# Patient Record
Sex: Male | Born: 1990 | Race: White | Hispanic: No | Marital: Single | Smoking: Current every day smoker
Health system: Southern US, Community
[De-identification: ages and names within clinical notes are randomized; demographics above are authoritative.]

---

## 2015-11-08 ENCOUNTER — Emergency Department (HOSPITAL_COMMUNITY): Payer: Self-pay

## 2015-11-08 ENCOUNTER — Emergency Department (HOSPITAL_COMMUNITY)
Admission: EM | Admit: 2015-11-08 | Discharge: 2015-11-08 | Disposition: A | Discharge status: Home / Self Care | Payer: Self-pay | Attending: Emergency Medicine | Admitting: Emergency Medicine

## 2015-11-08 ENCOUNTER — Encounter (HOSPITAL_COMMUNITY): Payer: Self-pay | Admitting: *Deleted

## 2015-11-08 DIAGNOSIS — Y990 Civilian activity done for income or pay: Secondary | ICD-10-CM | POA: Insufficient documentation

## 2015-11-08 DIAGNOSIS — Y9389 Activity, other specified: Secondary | ICD-10-CM | POA: Insufficient documentation

## 2015-11-08 DIAGNOSIS — T148XXA Other injury of unspecified body region, initial encounter: Secondary | ICD-10-CM

## 2015-11-08 DIAGNOSIS — W132XXA Fall from, out of or through roof, initial encounter: Secondary | ICD-10-CM | POA: Insufficient documentation

## 2015-11-08 DIAGNOSIS — S31119A Laceration without foreign body of abdominal wall, unspecified quadrant without penetration into peritoneal cavity, initial encounter: Secondary | ICD-10-CM | POA: Insufficient documentation

## 2015-11-08 DIAGNOSIS — W19XXXA Unspecified fall, initial encounter: Secondary | ICD-10-CM

## 2015-11-08 DIAGNOSIS — F17210 Nicotine dependence, cigarettes, uncomplicated: Secondary | ICD-10-CM | POA: Insufficient documentation

## 2015-11-08 DIAGNOSIS — Y929 Unspecified place or not applicable: Secondary | ICD-10-CM | POA: Insufficient documentation

## 2015-11-08 DIAGNOSIS — S21219A Laceration without foreign body of unspecified back wall of thorax without penetration into thoracic cavity, initial encounter: Secondary | ICD-10-CM | POA: Insufficient documentation

## 2015-11-08 DIAGNOSIS — R791 Abnormal coagulation profile: Secondary | ICD-10-CM | POA: Insufficient documentation

## 2015-11-08 LAB — I-STAT CHEM 8, ED
BUN: 10 mg/dL (ref 6–20)
Calcium, Ion: 1.16 mmol/L (ref 1.15–1.40)
Chloride: 102 mmol/L (ref 101–111)
Creatinine, Ser: 0.9 mg/dL (ref 0.61–1.24)
Glucose, Bld: 76 mg/dL (ref 65–99)
HCT: 50 % (ref 39.0–52.0)
Hemoglobin: 17 g/dL (ref 13.0–17.0)
Potassium: 4 mmol/L (ref 3.5–5.1)
Sodium: 142 mmol/L (ref 135–145)
TCO2: 28 mmol/L (ref 0–100)

## 2015-11-08 LAB — CBC WITH DIFFERENTIAL/PLATELET
Basophils Absolute: 0.1 10*3/uL (ref 0.0–0.1)
Basophils Relative: 0 %
EOS PCT: 3 %
Eosinophils Absolute: 0.4 10*3/uL (ref 0.0–0.7)
HEMATOCRIT: 47.7 % (ref 39.0–52.0)
Hemoglobin: 15.6 g/dL (ref 13.0–17.0)
Lymphocytes Relative: 18 %
Lymphs Abs: 2.5 10*3/uL (ref 0.7–4.0)
MCH: 31.8 pg (ref 26.0–34.0)
MCHC: 32.7 g/dL (ref 30.0–36.0)
MCV: 97.1 fL (ref 78.0–100.0)
MONO ABS: 1 10*3/uL (ref 0.1–1.0)
Monocytes Relative: 8 %
Neutro Abs: 9.6 10*3/uL — ABNORMAL HIGH (ref 1.7–7.7)
Neutrophils Relative %: 71 %
PLATELETS: 220 10*3/uL (ref 150–400)
RBC: 4.91 MIL/uL (ref 4.22–5.81)
RDW: 13.1 % (ref 11.5–15.5)
WBC: 13.5 10*3/uL — AB (ref 4.0–10.5)

## 2015-11-08 LAB — TYPE AND SCREEN
ABO/RH(D): A POS
Antibody Screen: NEGATIVE

## 2015-11-08 LAB — PROTIME-INR
INR: 1.09
PROTHROMBIN TIME: 14.1 s (ref 11.4–15.2)

## 2015-11-08 LAB — I-STAT CG4 LACTIC ACID, ED: Lactic Acid, Venous: 2.1 mmol/L (ref 0.5–1.9)

## 2015-11-08 LAB — ABO/RH: ABO/RH(D): A POS

## 2015-11-08 MED ORDER — FENTANYL CITRATE (PF) 100 MCG/2ML IJ SOLN
100.0000 ug | Freq: Once | INTRAMUSCULAR | Status: AC
  Administered 2015-11-08: 100 ug via INTRAVENOUS

## 2015-11-08 MED ORDER — FENTANYL CITRATE (PF) 100 MCG/2ML IJ SOLN
INTRAMUSCULAR | Status: AC
  Filled 2015-11-08: qty 2

## 2015-11-08 MED ORDER — LIDOCAINE-EPINEPHRINE (PF) 2 %-1:200000 IJ SOLN
10.0000 mL | Freq: Once | INTRAMUSCULAR | Status: AC
  Administered 2015-11-08: 10 mL via INTRADERMAL

## 2015-11-08 MED ORDER — SODIUM CHLORIDE 0.9 % IV BOLUS (SEPSIS)
1000.0000 mL | Freq: Once | INTRAVENOUS | Status: AC
  Administered 2015-11-08: 1000 mL via INTRAVENOUS

## 2015-11-08 MED ORDER — IOPAMIDOL (ISOVUE-300) INJECTION 61%
INTRAVENOUS | Status: AC
  Administered 2015-11-08: 100 mL
  Filled 2015-11-08: qty 100

## 2015-11-08 MED ORDER — HYDROCODONE-ACETAMINOPHEN 5-325 MG PO TABS
1.0000 | ORAL_TABLET | Freq: Once | ORAL | Status: AC
  Administered 2015-11-08: 1 via ORAL
  Filled 2015-11-08: qty 1

## 2015-11-08 MED ORDER — HYDROCODONE-ACETAMINOPHEN 5-325 MG PO TABS: 1.0000 | ORAL_TABLET | Freq: Four times a day (QID) | ORAL | 0 refills | Status: AC | PRN

## 2015-11-08 MED ORDER — FENTANYL CITRATE (PF) 100 MCG/2ML IJ SOLN
75.0000 ug | Freq: Once | INTRAMUSCULAR | Status: AC
  Administered 2015-11-08: 75 ug via INTRAVENOUS
  Filled 2015-11-08: qty 2

## 2015-11-08 MED ORDER — LIDOCAINE-EPINEPHRINE (PF) 2 %-1:200000 IJ SOLN
INTRAMUSCULAR | Status: AC
  Filled 2015-11-08: qty 10

## 2015-11-08 NOTE — ED Notes (Signed)
Co workers at bedside

## 2015-11-08 NOTE — ED Notes (Addendum)
New pressure dressing applied to patient right flank. Waiting for MD to re-assess patient. Pt remains alert and oriented x4. Following commands and answering questions appropriately.

## 2015-11-08 NOTE — Progress Notes (Signed)
Responded too level 2 trauma. Patient is a 25 yr old male who fail from ladder causing injury to back. Patient asked that his supervisor who had arrived at ED to be allow at bedside. Pt. Is alert and going to CT. Will follow as needed.   11/08/15 1222  Clinical Encounter Type  Visited With Patient;Health care provider  Visit Type Initial;ED  Referral From Nurse  Spiritual Encounters  Spiritual Needs Emotional  Stress Factors  Patient Stress Factors None identified  Advance Directives (For Healthcare)  Does patient have an advance directive? No  Would patient like information on creating an advanced directive? Yes - Educational materials given  Fae PippinWatlington, Melissaann Dizdarevic, Chaplain, [facility], Pager [phone]

## 2015-11-08 NOTE — ED Triage Notes (Signed)
Pt in via [facility] per report pt fell 20-25 ft from 2nd floor apt roof & 2 ft terracata roofing fell onto the pts back, pt has 4-5 inch lac to R flank & 3 inch lac to L flank, large amt of blood loss noted to R flank, denies LOC, pt arrives in C collar & LSB

## 2015-11-08 NOTE — Discharge Instructions (Signed)
We saw you in the ER for your WOUND. °Please read the instructions provided on wound care. °Keep the area clean and dry, apply bacitracin ointment daily and take the medications provided. °RETURN TO THE ER IF THERE IS INCREASED PAIN, REDNESS, PUS COMING OUT from the wound site.  °

## 2015-11-08 NOTE — Procedures (Signed)
Brief operative note  Preoperative diagnosis: 6 cm right flank laceration, 3cm left flank laceration Post-operative diagnosis: same Procedure: layered closure 6 cm R flank laceration, simple closure 3 cm L flank laceration Surgeon: Violeta GelinasBurke Ivannah Zody, MD Anesthesia: local plus IV pain medication Procedure in detail: Both wounds were thoroughly irrigated and prepped in a sterile fashion. Attention was directed to the right flank wound. There were some active bleeders in the musculature which were controlled with figure-of-eight 3-0 Vicryl sutures. Next, the wound was closed with running 3-0 Prolene. There was some muscular hematoma present but good hemostasis. Next, left flank laceration was closed in simple fashion with running 3-0 Prolene. He tolerated the procedure well.  Violeta GelinasBurke Minervia Osso, MD, MPH, FACS Trauma: [phone] General Surgery: [phone]

## 2015-11-08 NOTE — Progress Notes (Signed)
Orthopedic Tech Progress Note Patient Details:  Chanetta MarshallJerry Belmar 10-03-1990 [id number]  Patient ID: Chanetta MarshallJerry Kagan, male   DOB: 10-03-1990, 25 y.o.   MRN: [id number]   Nikki DomCrawford, Nohemy Koop 11/08/2015, 12:39 PM Made level 2 trauma visit

## 2015-11-08 NOTE — ED Notes (Signed)
Pt noted to be bleeding through pressure dressing applied on arrival. Large amount of further blood loss noted to chucks and sheet. MD Rhunette CroftNanavati reminded patient has returned to room and needs reassessment.

## 2015-11-08 NOTE — ED Notes (Signed)
Pt in CT scan, mod amt of blood present, bandage changed, Nanavati aware, This RN & Psychologist, clinicalAna RN at bedside in CT scan, pt to have scan completed & Rhunette CroftNanavati, MD will reassess pt when he returns to the trauma room, pt A&O x4, follows commands, speaks in complete sentences

## 2015-11-08 NOTE — Consult Note (Signed)
Reason for Consult:B flank lacerations Referring Physician: Claris Pongnkit Nanavati  Donald Frye is an 25 y.o. male.  HPI: Donald SorrowJerry was working up on a second story roof when he fell. After falling, he was struck by some terra-cotta tile in his lower back. No loss of consciousness. He came in as a level II trauma. Workup has revealed bilateral flank lacerations with some hemorrhage from the right side. We were asked to see him for management of his lacerations. No internal injuries were noted on his workup and he has been hemodynamically normal.  History reviewed. No pertinent past medical history.  History reviewed. No pertinent surgical history.  No family history on file.  Social History:  reports that he has been smoking Cigarettes.  He has been smoking about 0.50 packs per day. He has never used smokeless tobacco. He reports that he drinks about 8.4 oz of alcohol per week . His drug history is not on file.  Allergies:  Allergies  Allergen Reactions  . Penicillins Anaphylaxis    Medications: I have reviewed the patient's current medications.  Results for orders placed or performed during the hospital encounter of 11/08/15 (from the past 48 hour(s))  Protime-INR     Status: None   Collection Time: 11/08/15 12:13 PM  Result Value Ref Range   Prothrombin Time 14.1 11.4 - 15.2 seconds   INR 1.09   Type and screen     Status: None   Collection Time: 11/08/15 12:15 PM  Result Value Ref Range   ABO/RH(D) A POS    Antibody Screen NEG    Sample Expiration 11/11/2015   ABO/Rh     Status: None   Collection Time: 11/08/15 12:15 PM  Result Value Ref Range   ABO/RH(D) A POS   I-stat chem 8, ed     Status: None   Collection Time: 11/08/15 12:23 PM  Result Value Ref Range   Sodium 142 135 - 145 mmol/L   Potassium 4.0 3.5 - 5.1 mmol/L   Chloride 102 101 - 111 mmol/L   BUN 10 6 - 20 mg/dL   Creatinine, Ser 8.290.90 0.61 - 1.24 mg/dL   Glucose, Bld 76 65 - 99 mg/dL   Calcium, Ion 5.621.16 1.301.15 - 1.40  mmol/L   TCO2 28 0 - 100 mmol/L   Hemoglobin 17.0 13.0 - 17.0 g/dL   HCT 86.550.0 78.439.0 - 69.652.0 %  I-Stat CG4 Lactic Acid, ED     Status: Abnormal   Collection Time: 11/08/15 12:23 PM  Result Value Ref Range   Lactic Acid, Venous 2.10 (HH) 0.5 - 1.9 mmol/L   Comment NOTIFIED PHYSICIAN   CBC with Differential     Status: Abnormal   Collection Time: 11/08/15 12:50 PM  Result Value Ref Range   WBC 13.5 (H) 4.0 - 10.5 K/uL   RBC 4.91 4.22 - 5.81 MIL/uL   Hemoglobin 15.6 13.0 - 17.0 g/dL   HCT 29.547.7 28.439.0 - 13.252.0 %   MCV 97.1 78.0 - 100.0 fL   MCH 31.8 26.0 - 34.0 pg   MCHC 32.7 30.0 - 36.0 g/dL   RDW 44.013.1 10.211.5 - 72.515.5 %   Platelets 220 150 - 400 K/uL   Neutrophils Relative % 71 %   Neutro Abs 9.6 (H) 1.7 - 7.7 K/uL   Lymphocytes Relative 18 %   Lymphs Abs 2.5 0.7 - 4.0 K/uL   Monocytes Relative 8 %   Monocytes Absolute 1.0 0.1 - 1.0 K/uL   Eosinophils Relative 3 %  Eosinophils Absolute 0.4 0.0 - 0.7 K/uL   Basophils Relative 0 %   Basophils Absolute 0.1 0.0 - 0.1 K/uL    Ct Cervical Spine Wo Contrast  Result Date: 11/08/2015 CLINICAL DATA:  Fall 20-25 feet from second floor apartment roof onto back. Bilateral flank lacerations and bleeding. EXAM: CT CERVICAL SPINE WITHOUT CONTRAST TECHNIQUE: Multidetector CT imaging of the cervical spine was performed without intravenous contrast. Multiplanar CT image reconstructions were also generated. COMPARISON:  None. FINDINGS: No fracture is detected in the cervical spine. No prevertebral soft tissue swelling. There is straightening of the cervical spine, usually due to positioning and/or muscle spasm. Dens is well positioned between the lateral masses of C1. The lateral masses appear well-aligned. Cervical disc heights are preserved, with no significant spondylosis. No significant facet arthropathy. No significant cervical foraminal stenosis. No cervical spine subluxation. Visualized mastoid air cells appear clear. No evidence of intra-axial hemorrhage  in the visualized brain. No gross cervical canal hematoma. No significant pulmonary nodules at the visualized lung apices. No cervical adenopathy or other significant neck soft tissue abnormality. IMPRESSION: No cervical spine fracture or subluxation. Electronically Signed   By: Delbert Phenix M.D.   On: 11/08/2015 14:22   Ct Abdomen Pelvis W Contrast  Result Date: 11/08/2015 CLINICAL DATA:  25 y/o M; status post fall 20-25 feet from second floor apartment roof after which 2 foot tear caught a roofing fell onto patient's back. The patient denies loss of consciousness. Lacerations to the flanks bilaterally. Bony new dx EXAM: CT ABDOMEN AND PELVIS WITH CONTRAST CT LUMBAR SPINE WITHOUT CONTRAST TECHNIQUE: Multidetector CT imaging of the abdomen and pelvis was performed using the standard protocol following bolus administration of intravenous contrast. CONTRAST:  1 ISOVUE-300 IOPAMIDOL (ISOVUE-300) INJECTION 61% COMPARISON:  None. FINDINGS: CT abdomen and pelvis: Lower chest:  No acute findings. Hepatobiliary: No masses or other significant abnormality. Pancreas: No mass, inflammatory changes, or other significant abnormality. Spleen: Within normal limits in size and appearance. Adrenals/Urinary Tract: No masses identified. No evidence of hydronephrosis. Stomach/Bowel: No evidence of obstruction, inflammatory process, or abnormal fluid collections. Vascular/Lymphatic: No pathologically enlarged lymph nodes. No evidence of abdominal aortic aneurysm. Reproductive: No mass or other significant abnormality. Other: None. Musculoskeletal: There is a large laceration of the right flank skin extending into the right paraspinal muscles. Deep to the laceration within right paraspinal muscles at the L1 level there is active extravasation of contrast increasing between venous and delayed phases (series 3, image 37 and series 2, image 25). The developing hematoma measures approximately 22 x 30 mm axially. There are several  Schmorl's nodes of multiple endplates. No acute fracture or dislocation is identified. L1 vertebral body hemangioma. CT lumbar spine: Alignment: Lumbar lordosis is maintained. No listhesis. Mild leftward curvature with apex at L4. Bones: No acute fracture or malalignment is identified. There are endplate Schmorl's nodes of superior endplates of T12, L1, and L2. Soft tissues: Laceration to the right flank with swelling of right paraspinal muscles and developing right paraspinal muscle hematoma better characterize the above. Disc levels: There are multiple small disc bulges throughout the lumbar spine without high-grade canal stenosis or foraminal narrowing identified. IMPRESSION: 1. Large right flank laceration. Active extravasation of contrast into the right paraspinal muscles at the L1 level with developing hematoma measuring up to approximately 3 cm. No associated large vessel is identified. 2. No acute fracture or dislocation is identified. 3. No acute or organ or vascular injury in the abdomen or pelvis is identified Electronically Signed  By: Mitzi Hansen M.D.   On: 11/08/2015 14:33   Ct L-spine No Charge  Result Date: 11/08/2015 CLINICAL DATA:  26 y/o M; status post fall 20-25 feet from second floor apartment roof after which 2 foot tear caught a roofing fell onto patient's back. The patient denies loss of consciousness. Lacerations to the flanks bilaterally. Bony new dx EXAM: CT ABDOMEN AND PELVIS WITH CONTRAST CT LUMBAR SPINE WITHOUT CONTRAST TECHNIQUE: Multidetector CT imaging of the abdomen and pelvis was performed using the standard protocol following bolus administration of intravenous contrast. CONTRAST:  1 ISOVUE-300 IOPAMIDOL (ISOVUE-300) INJECTION 61% COMPARISON:  None. FINDINGS: CT abdomen and pelvis: Lower chest:  No acute findings. Hepatobiliary: No masses or other significant abnormality. Pancreas: No mass, inflammatory changes, or other significant abnormality. Spleen: Within  normal limits in size and appearance. Adrenals/Urinary Tract: No masses identified. No evidence of hydronephrosis. Stomach/Bowel: No evidence of obstruction, inflammatory process, or abnormal fluid collections. Vascular/Lymphatic: No pathologically enlarged lymph nodes. No evidence of abdominal aortic aneurysm. Reproductive: No mass or other significant abnormality. Other: None. Musculoskeletal: There is a large laceration of the right flank skin extending into the right paraspinal muscles. Deep to the laceration within right paraspinal muscles at the L1 level there is active extravasation of contrast increasing between venous and delayed phases (series 3, image 37 and series 2, image 25). The developing hematoma measures approximately 22 x 30 mm axially. There are several Schmorl's nodes of multiple endplates. No acute fracture or dislocation is identified. L1 vertebral body hemangioma. CT lumbar spine: Alignment: Lumbar lordosis is maintained. No listhesis. Mild leftward curvature with apex at L4. Bones: No acute fracture or malalignment is identified. There are endplate Schmorl's nodes of superior endplates of T12, L1, and L2. Soft tissues: Laceration to the right flank with swelling of right paraspinal muscles and developing right paraspinal muscle hematoma better characterize the above. Disc levels: There are multiple small disc bulges throughout the lumbar spine without high-grade canal stenosis or foraminal narrowing identified. IMPRESSION: 1. Large right flank laceration. Active extravasation of contrast into the right paraspinal muscles at the L1 level with developing hematoma measuring up to approximately 3 cm. No associated large vessel is identified. 2. No acute fracture or dislocation is identified. 3. No acute or organ or vascular injury in the abdomen or pelvis is identified Electronically Signed   By: Mitzi Hansen M.D.   On: 11/08/2015 14:33    Review of Systems  Constitutional:  Negative for fever.  Eyes: Negative for blurred vision.  Respiratory: Negative for cough and shortness of breath.   Cardiovascular: Negative for chest pain.  Gastrointestinal: Negative for abdominal pain, nausea and vomiting.  Genitourinary: Negative.   Musculoskeletal: Positive for back pain.       At lacerations  Skin: Negative for rash.  Neurological: Negative for sensory change, speech change, focal weakness, loss of consciousness and headaches.  Endo/Heme/Allergies: Negative.   Psychiatric/Behavioral: Negative.    Blood pressure 122/73, pulse 103, temperature 98.9 F (37.2 C), temperature source Oral, resp. rate 23, height 5\' 10"  (1.778 m), weight 93 kg (205 lb), SpO2 96 %. Physical Exam  Constitutional: He is oriented to person, place, and time. He appears well-developed and well-nourished. No distress.  HENT:  Head: Normocephalic.  Right Ear: External ear normal.  Left Ear: External ear normal.  Mouth/Throat: Oropharynx is clear and moist.  Eyes: EOM are normal. Pupils are equal, round, and reactive to light.  Neck: No tracheal deviation present.  Cardiovascular: Normal rate,  regular rhythm and normal heart sounds.   Respiratory: Effort normal and breath sounds normal. No respiratory distress. He has no wheezes.  GI: Soft. He exhibits no distension. There is no tenderness. There is no rebound.  Musculoskeletal:       Arms: 6 cm right flank laceration down into musculature with a couple small areas of active bleeding. 3 cm superficial left flank laceration.  Neurological: He is alert and oriented to person, place, and time. He displays no atrophy and no tremor. He exhibits normal muscle tone. He displays no seizure activity. GCS eye subscore is 4. GCS verbal subscore is 5. GCS motor subscore is 6.  Moves all extremities well    Assessment/Plan: Fall 6 cm right flank laceration with some active bleeding 3 cm left flank laceration  He was given some intravenous pain  medication and the wounds were closed in the emergency department. Please refer to procedure note. Keep wounds covered for 48 hours. He would like to follow-up for suture removal in [facility] I advised him to see an urgent care or emergency department there in 2 weeks. No working for the 2 weeks until sutures are removed.  Miski Feldpausch E 11/08/2015, 3:19 PM

## 2015-11-09 NOTE — ED Provider Notes (Signed)
[facility]-EMERGENCY DEPT Provider Note   CSN: [id number] Arrival date & time: 11/08/15  1207     History   Chief Complaint Chief Complaint  Patient presents with  . Fall    HPI Donald Frye is a 25 y.o. male.  HPI  Donald Frye is a young and healthy male who was working up on a second story roof when he fell. Fall was due to loss of balance. After falling, he was struck by some terra-cotta tile in his lower back. Donald Frye fell onto his legs. Donald Frye didn't strike his head and he had no loss of consciousness. Donald Frye is having some bleeding from the R flank region and has back pain at that level. He denies any leg or pelvis pain. No numbness, tingling. Donald Frye has no pain in his legs or ankle. Donald Frye denies any drug use or drinking.  History reviewed. No pertinent past medical history.  There are no active problems to display for this patient.   History reviewed. No pertinent surgical history.     Home Medications    Prior to Admission medications   Medication Sig Start Date End Date Taking? Authorizing Provider  HYDROcodone-acetaminophen (NORCO/VICODIN) 5-325 MG tablet Take 1 tablet by mouth every 6 (six) hours as needed. 11/08/15   Derwood KaplanAnkit Kieren Adkison, MD    Family History No family history on file.  Social History Social History  Substance Use Topics  . Smoking status: Current Every Day Smoker    Packs/day: 0.50    Types: Cigarettes  . Smokeless tobacco: Never Used  . Alcohol use 8.4 oz/week    14 Cans of beer per week     Allergies   Penicillins   Review of Systems Review of Systems  ROS 10 Systems reviewed and are negative for acute change except as noted in the HPI.     Physical Exam Updated Vital Signs BP 114/58   Pulse 97   Temp 98.9 F (37.2 C) (Oral)   Resp 23   Ht 5\' 10"  (1.778 m)   Wt 205 lb (93 kg)   SpO2 92%   BMI 29.41 kg/m   Physical Exam  Constitutional: He is oriented to person, place, and time. He appears well-developed.  HENT:  Head: Normocephalic and  atraumatic.  Eyes: Conjunctivae and EOM are normal. Pupils are equal, round, and reactive to light.  Neck: Normal range of motion. Neck supple.  No midline c-spine tenderness, Donald Frye able to turn head to 45 degrees bilaterally without any pain and able to flex neck to the chest and extend without any pain or neurologic symptoms.   Cardiovascular: Normal rate, regular rhythm and normal heart sounds.   Pulmonary/Chest: Effort normal and breath sounds normal. No respiratory distress. He has no wheezes.  Abdominal: Soft. Bowel sounds are normal. He exhibits no distension. There is no tenderness. There is no rebound and no guarding.  Musculoskeletal:  Head to toe evaluation shows no hematoma, bleeding of the scalp, no facial abrasions, step offs, crepitus, no tenderness to palpation of the bilateral upper and lower extremities, no gross deformities, no chest tenderness, no pelvic pain.   Neurological: He is alert and oriented to person, place, and time.  Skin: Skin is warm.  Large, deep laceration to the R flank region with active bleeding. Also, thin laceration to the L side of the back, no bleeding.     ED Treatments / Results  Labs (all labs ordered are listed, but only abnormal results are displayed) Labs Reviewed  CBC WITH DIFFERENTIAL/PLATELET -  Abnormal; Notable for the following:       Result Value   WBC 13.5 (*)    Neutro Abs 9.6 (*)    All other components within normal limits  I-STAT CG4 LACTIC ACID, ED - Abnormal; Notable for the following:    Lactic Acid, Venous 2.10 (*)    All other components within normal limits  PROTIME-INR  CBC WITH DIFFERENTIAL/PLATELET  I-STAT CHEM 8, ED  TYPE AND SCREEN  ABO/RH    EKG  EKG Interpretation None       Radiology Ct Cervical Spine Wo Contrast  Result Date: 11/08/2015 CLINICAL DATA:  Fall 20-25 feet from second floor apartment roof onto back. Bilateral flank lacerations and bleeding. EXAM: CT CERVICAL SPINE WITHOUT CONTRAST  TECHNIQUE: Multidetector CT imaging of the cervical spine was performed without intravenous contrast. Multiplanar CT image reconstructions were also generated. COMPARISON:  None. FINDINGS: No fracture is detected in the cervical spine. No prevertebral soft tissue swelling. There is straightening of the cervical spine, usually due to positioning and/or muscle spasm. Dens is well positioned between the lateral masses of C1. The lateral masses appear well-aligned. Cervical disc heights are preserved, with no significant spondylosis. No significant facet arthropathy. No significant cervical foraminal stenosis. No cervical spine subluxation. Visualized mastoid air cells appear clear. No evidence of intra-axial hemorrhage in the visualized brain. No gross cervical canal hematoma. No significant pulmonary nodules at the visualized lung apices. No cervical adenopathy or other significant neck soft tissue abnormality. IMPRESSION: No cervical spine fracture or subluxation. Electronically Signed   By: Delbert Phenix M.D.   On: 11/08/2015 14:22   Ct Abdomen Pelvis W Contrast  Result Date: 11/08/2015 CLINICAL DATA:  25 y/o M; status post fall 20-25 feet from second floor apartment roof after which 2 foot tear caught a roofing fell onto patient's back. The patient denies loss of consciousness. Lacerations to the flanks bilaterally. Bony new dx EXAM: CT ABDOMEN AND PELVIS WITH CONTRAST CT LUMBAR SPINE WITHOUT CONTRAST TECHNIQUE: Multidetector CT imaging of the abdomen and pelvis was performed using the standard protocol following bolus administration of intravenous contrast. CONTRAST:  1 ISOVUE-300 IOPAMIDOL (ISOVUE-300) INJECTION 61% COMPARISON:  None. FINDINGS: CT abdomen and pelvis: Lower chest:  No acute findings. Hepatobiliary: No masses or other significant abnormality. Pancreas: No mass, inflammatory changes, or other significant abnormality. Spleen: Within normal limits in size and appearance. Adrenals/Urinary Tract: No  masses identified. No evidence of hydronephrosis. Stomach/Bowel: No evidence of obstruction, inflammatory process, or abnormal fluid collections. Vascular/Lymphatic: No pathologically enlarged lymph nodes. No evidence of abdominal aortic aneurysm. Reproductive: No mass or other significant abnormality. Other: None. Musculoskeletal: There is a large laceration of the right flank skin extending into the right paraspinal muscles. Deep to the laceration within right paraspinal muscles at the L1 level there is active extravasation of contrast increasing between venous and delayed phases (series 3, image 37 and series 2, image 25). The developing hematoma measures approximately 22 x 30 mm axially. There are several Schmorl's nodes of multiple endplates. No acute fracture or dislocation is identified. L1 vertebral body hemangioma. CT lumbar spine: Alignment: Lumbar lordosis is maintained. No listhesis. Mild leftward curvature with apex at L4. Bones: No acute fracture or malalignment is identified. There are endplate Schmorl's nodes of superior endplates of T12, L1, and L2. Soft tissues: Laceration to the right flank with swelling of right paraspinal muscles and developing right paraspinal muscle hematoma better characterize the above. Disc levels: There are multiple small disc bulges throughout  the lumbar spine without high-grade canal stenosis or foraminal narrowing identified. IMPRESSION: 1. Large right flank laceration. Active extravasation of contrast into the right paraspinal muscles at the L1 level with developing hematoma measuring up to approximately 3 cm. No associated large vessel is identified. 2. No acute fracture or dislocation is identified. 3. No acute or organ or vascular injury in the abdomen or pelvis is identified Electronically Signed   By: Mitzi Hansen M.D.   On: 11/08/2015 14:33   Ct L-spine No Charge  Result Date: 11/08/2015 CLINICAL DATA:  25 y/o M; status post fall 20-25 feet from  second floor apartment roof after which 2 foot tear caught a roofing fell onto patient's back. The patient denies loss of consciousness. Lacerations to the flanks bilaterally. Bony new dx EXAM: CT ABDOMEN AND PELVIS WITH CONTRAST CT LUMBAR SPINE WITHOUT CONTRAST TECHNIQUE: Multidetector CT imaging of the abdomen and pelvis was performed using the standard protocol following bolus administration of intravenous contrast. CONTRAST:  1 ISOVUE-300 IOPAMIDOL (ISOVUE-300) INJECTION 61% COMPARISON:  None. FINDINGS: CT abdomen and pelvis: Lower chest:  No acute findings. Hepatobiliary: No masses or other significant abnormality. Pancreas: No mass, inflammatory changes, or other significant abnormality. Spleen: Within normal limits in size and appearance. Adrenals/Urinary Tract: No masses identified. No evidence of hydronephrosis. Stomach/Bowel: No evidence of obstruction, inflammatory process, or abnormal fluid collections. Vascular/Lymphatic: No pathologically enlarged lymph nodes. No evidence of abdominal aortic aneurysm. Reproductive: No mass or other significant abnormality. Other: None. Musculoskeletal: There is a large laceration of the right flank skin extending into the right paraspinal muscles. Deep to the laceration within right paraspinal muscles at the L1 level there is active extravasation of contrast increasing between venous and delayed phases (series 3, image 37 and series 2, image 25). The developing hematoma measures approximately 22 x 30 mm axially. There are several Schmorl's nodes of multiple endplates. No acute fracture or dislocation is identified. L1 vertebral body hemangioma. CT lumbar spine: Alignment: Lumbar lordosis is maintained. No listhesis. Mild leftward curvature with apex at L4. Bones: No acute fracture or malalignment is identified. There are endplate Schmorl's nodes of superior endplates of T12, L1, and L2. Soft tissues: Laceration to the right flank with swelling of right paraspinal  muscles and developing right paraspinal muscle hematoma better characterize the above. Disc levels: There are multiple small disc bulges throughout the lumbar spine without high-grade canal stenosis or foraminal narrowing identified. IMPRESSION: 1. Large right flank laceration. Active extravasation of contrast into the right paraspinal muscles at the L1 level with developing hematoma measuring up to approximately 3 cm. No associated large vessel is identified. 2. No acute fracture or dislocation is identified. 3. No acute or organ or vascular injury in the abdomen or pelvis is identified Electronically Signed   By: Mitzi Hansen M.D.   On: 11/08/2015 14:33    Procedures Procedures (including critical care time)  Medications Ordered in ED Medications  iopamidol (ISOVUE-300) 61 % injection (100 mLs  Contrast Given 11/08/15 1312)  fentaNYL (SUBLIMAZE) injection 75 mcg (75 mcg Intravenous Given 11/08/15 1357)  sodium chloride 0.9 % bolus 1,000 mL (0 mLs Intravenous Stopped 11/08/15 1605)  fentaNYL (SUBLIMAZE) injection 100 mcg (100 mcg Intravenous Given 11/08/15 1452)  lidocaine-EPINEPHrine (XYLOCAINE W/EPI) 2 %-1:200000 (PF) injection 10 mL (10 mLs Intradermal Given 11/08/15 1457)  HYDROcodone-acetaminophen (NORCO/VICODIN) 5-325 MG per tablet 1 tablet (1 tablet Oral Given 11/08/15 1705)     Initial Impression / Assessment and Plan / ED Course  I have  reviewed the triage vital signs and the nursing notes.  Pertinent labs & imaging results that were available during my care of the patient were reviewed by me and considered in my medical decision making (see chart for details).  Clinical Course    Given the mechanism of the injury - CT cspine and CT abd pelvis ordered with reformats for the L spine. Tdap is UTD. Trauma to help with the laceration repair.  Final Clinical Impressions(s) / ED Diagnoses   Final diagnoses:  Laceration of flank, initial encounter  Fall, initial encounter    Contusion    New Prescriptions There are no discharge medications for this patient.    Derwood Kaplan, MD 11/09/15 [phone]

## 2018-08-02 IMAGING — CT CT CERVICAL SPINE W/O CM
3 of 4 series · 12 of 35 positions shown, 14 images · non-contrast
Comparison: None.

CLINICAL DATA: Fall 20-25 feet from second floor apartment roof
onto back. Bilateral flank lacerations and bleeding.

EXAM:
CT CERVICAL SPINE WITHOUT CONTRAST
TECHNIQUE: Multidetector CT imaging of the cervical spine was performed without
intravenous contrast. Multiplanar CT image reconstructions were also
generated.

[Series 4: c_spine 2.0 st · axial · 0.32mm/px · z∈[-375,-237]mm · 4 of 105 slices shown, 5 images]
[im 18/105  soft-tissue]
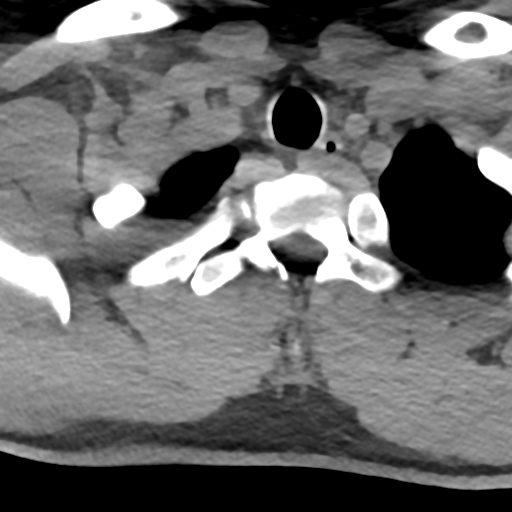
[im 18/105  bone]
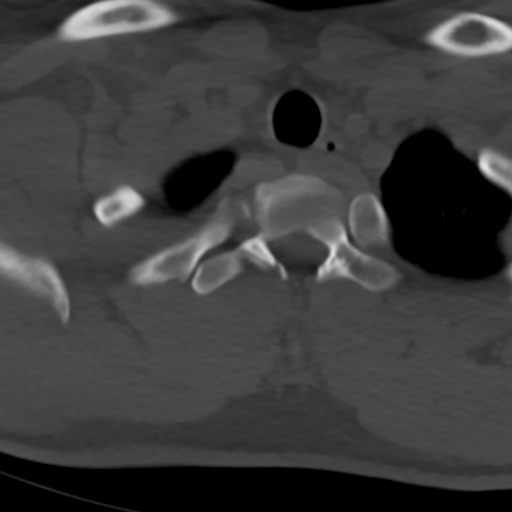
[im 35/105  bone]
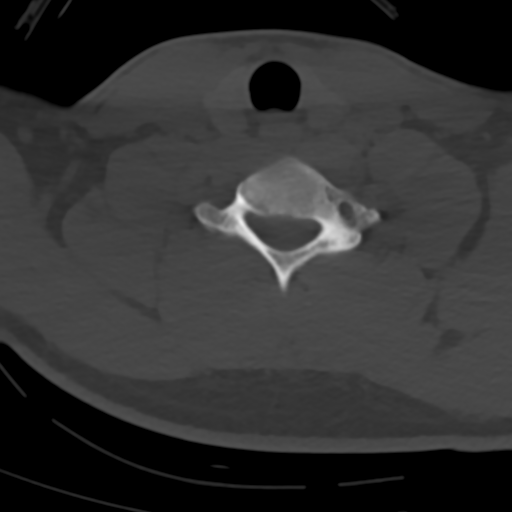
[im 70/105  bone]
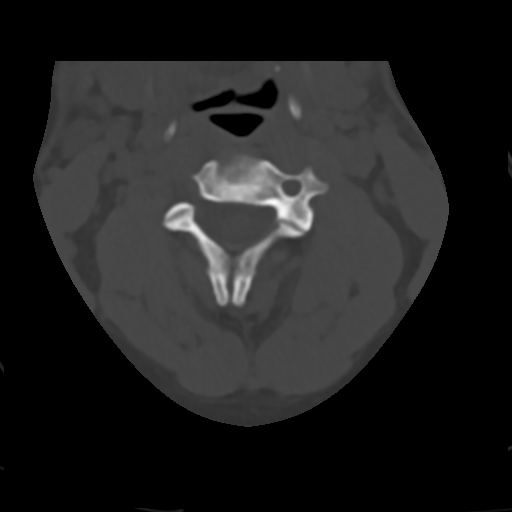
[im 87/105  bone]
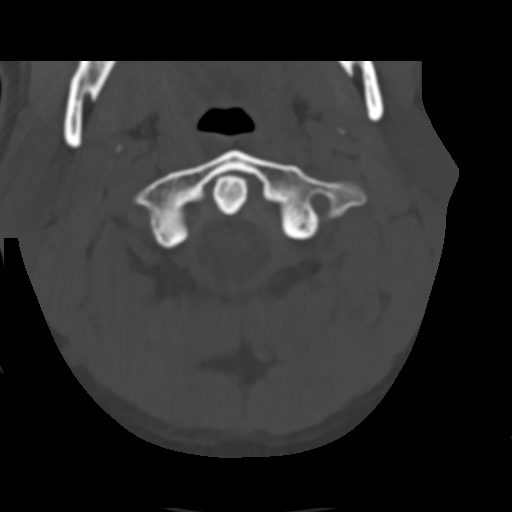

[Series 6: c_spine 2.0 sag bone · sagittal · 0.31mm/px · 5 of 61 slices shown, 6 images]
[im 21/61  bone]
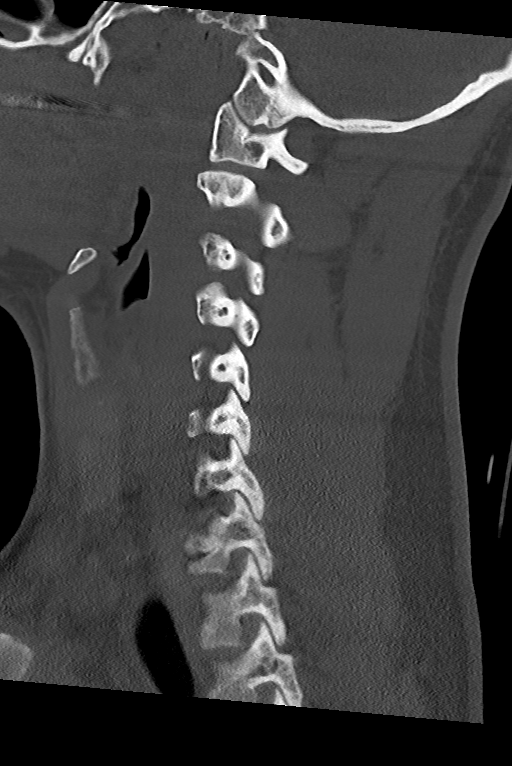
[im 26/61  bone]
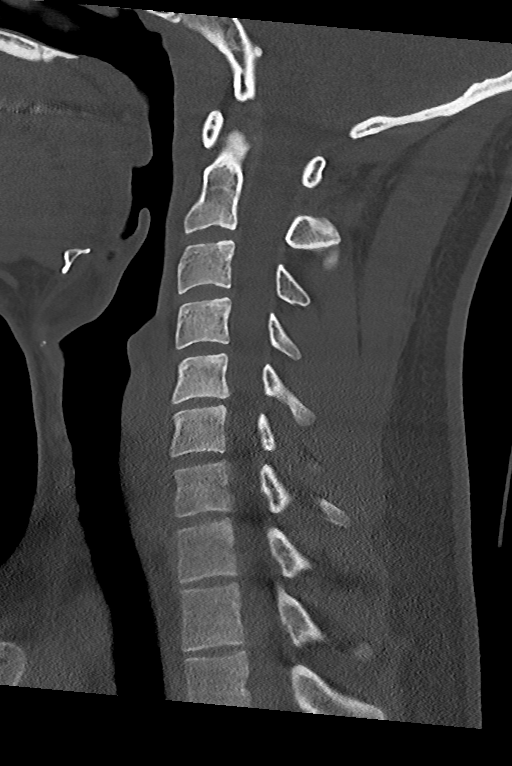
[im 31/61  soft-tissue]
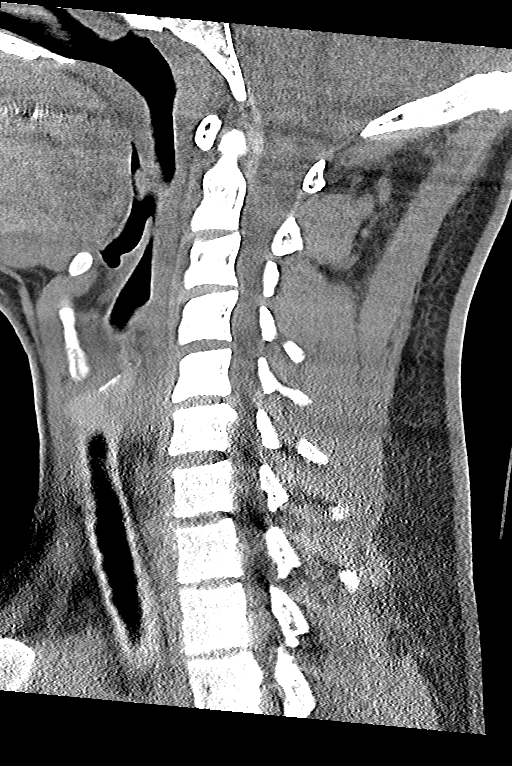
[im 31/61  bone]
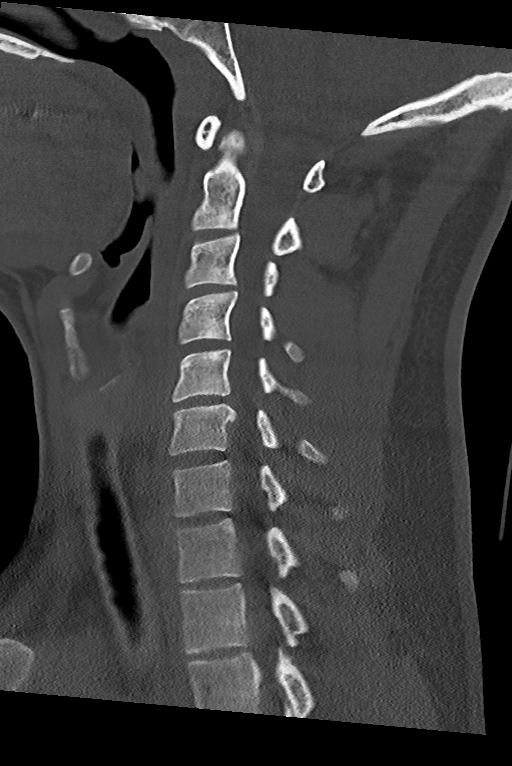
[im 36/61  bone]
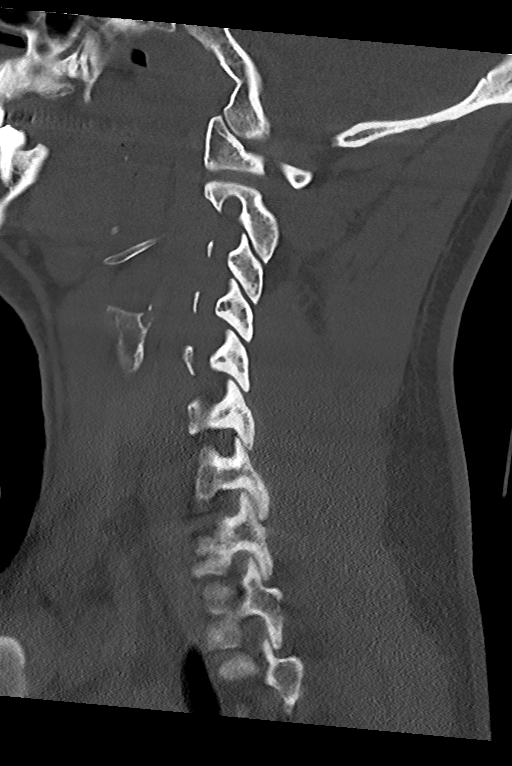
[im 41/61  bone]
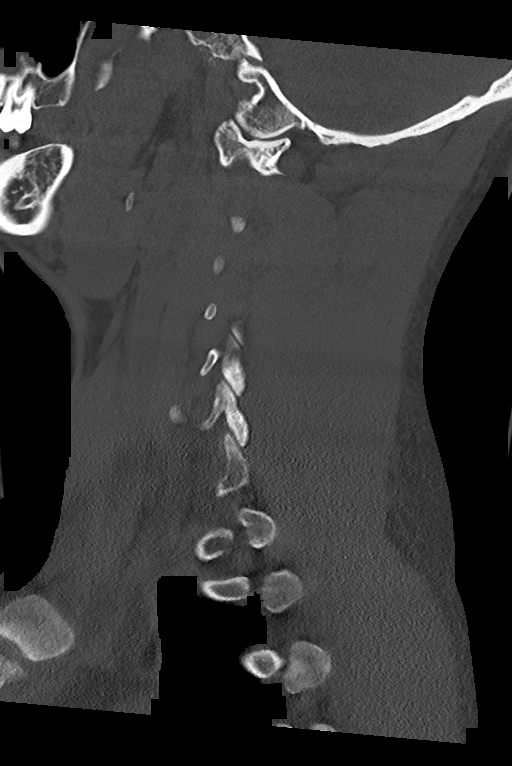

[Series 7: c_spine 2.0 cor bone · coronal · 0.32mm/px · 3 of 73 slices shown]
[im 15/73  bone]
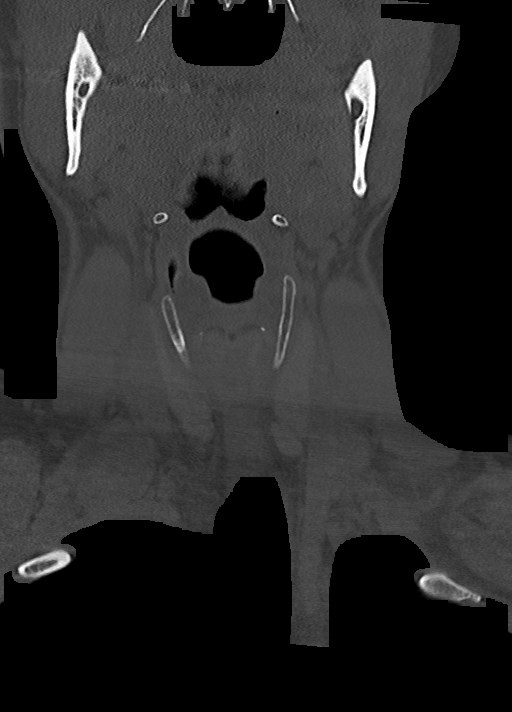
[im 29/73  bone]
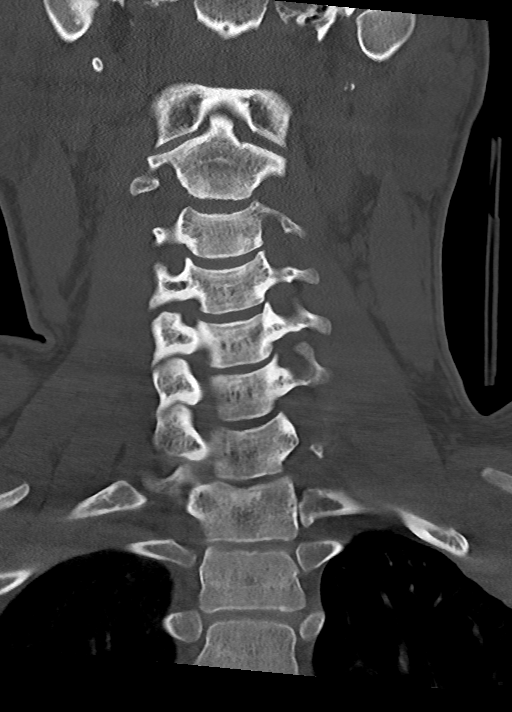
[im 44/73  bone]
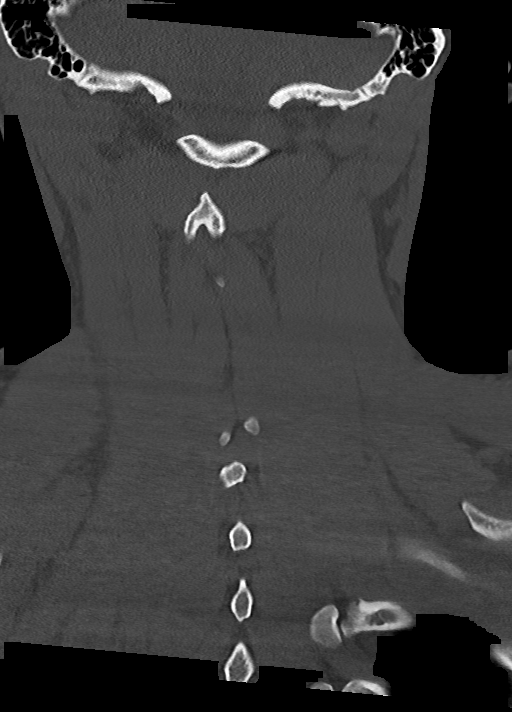

[12 of 35 positions shown; findings below may reference images not displayed]

FINDINGS: No fracture is detected in the cervical spine. No prevertebral soft
tissue swelling. There is straightening of the cervical spine,
usually due to positioning and/or muscle spasm. Dens is well
positioned between the lateral masses of C1. The lateral masses
appear well-aligned. Cervical disc heights are preserved, with no
significant spondylosis. No significant facet arthropathy. No
significant cervical foraminal stenosis. No cervical spine
subluxation.

Visualized mastoid air cells appear clear. No evidence of
intra-axial hemorrhage in the visualized brain. No gross cervical
canal hematoma. No significant pulmonary nodules at the visualized
lung apices. No cervical adenopathy or other significant neck soft
tissue abnormality.
IMPRESSION: No cervical spine fracture or subluxation.

## 2018-08-02 IMAGING — CT CT ABD-PELV W/ CM
3 of 9 series · 10 of 46 positions shown, 15 images · IV contrast (Omni 300)
Comparison: None.

CLINICAL DATA: 25 y/o M; status post fall [DATE] feet from second
floor apartment roof after which 2 foot tear caught a roofing fell
onto patient's back. The patient denies loss of consciousness.
Lacerations to the flanks bilaterally. Bony new dx

EXAM:
CT ABDOMEN AND PELVIS WITH CONTRAST
CT LUMBAR SPINE WITHOUT CONTRAST
TECHNIQUE: Multidetector CT imaging of the abdomen and pelvis was performed
using the standard protocol following bolus administration of
intravenous contrast.
CONTRAST:  1 4I7GRC-5LL IOPAMIDOL (4I7GRC-5LL) INJECTION 61%

[Series 3: a/p w/ 5mm · axial · 0.77mm/px · z∈[-940,-565]mm · 6 of 106 slices shown, 11 images]
[im 16/106  soft-tissue]
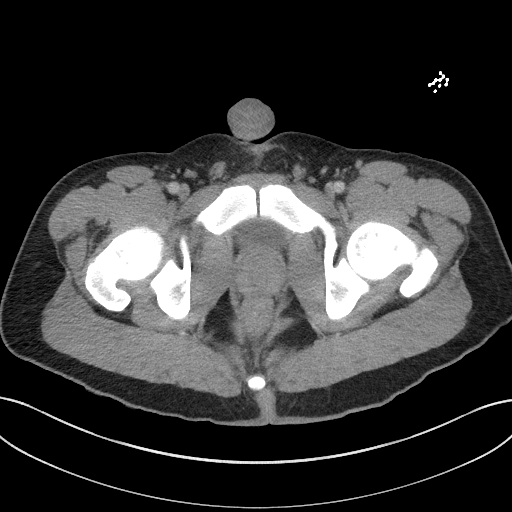
[im 16/106  bone]
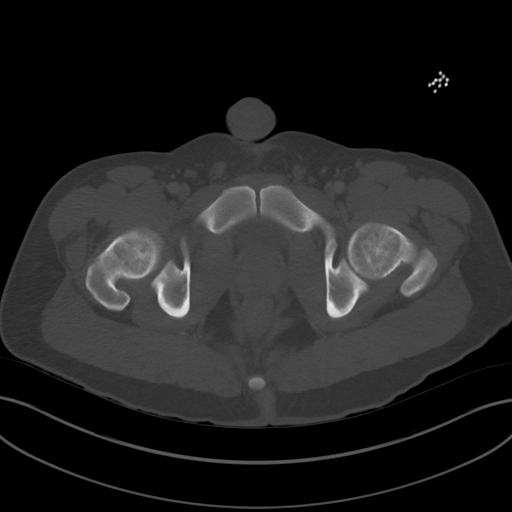
[im 31/106  soft-tissue]
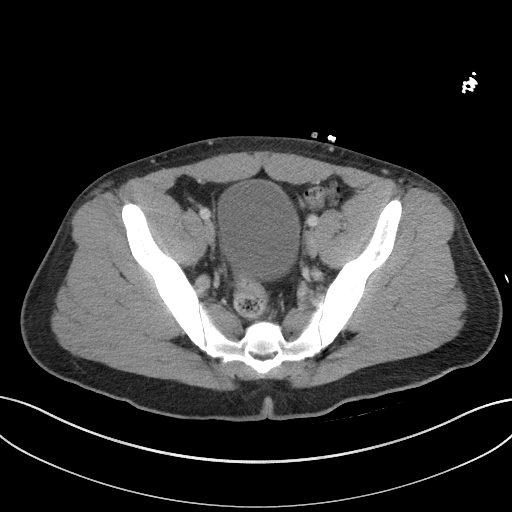
[im 46/106  soft-tissue]
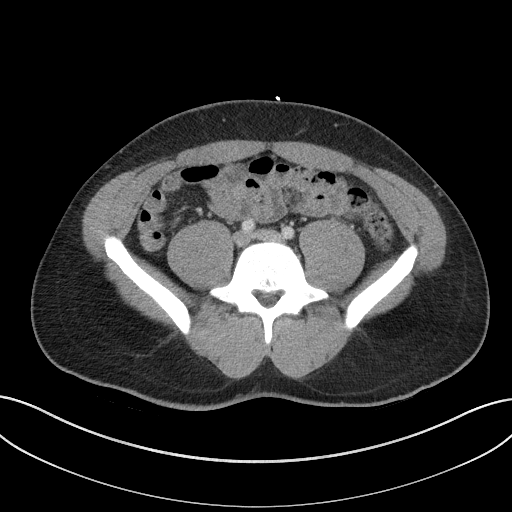
[im 46/106  lung]
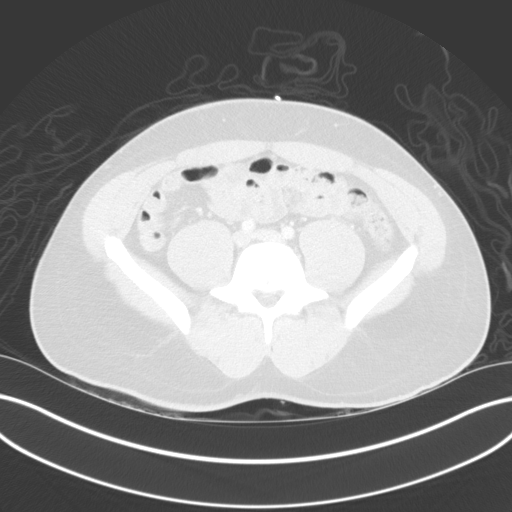
[im 61/106  soft-tissue]
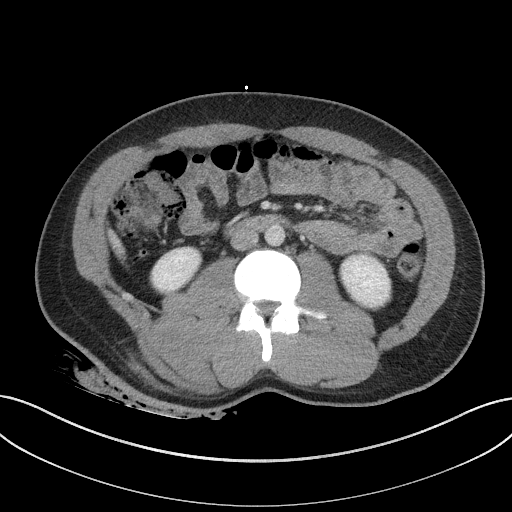
[im 61/106  lung]
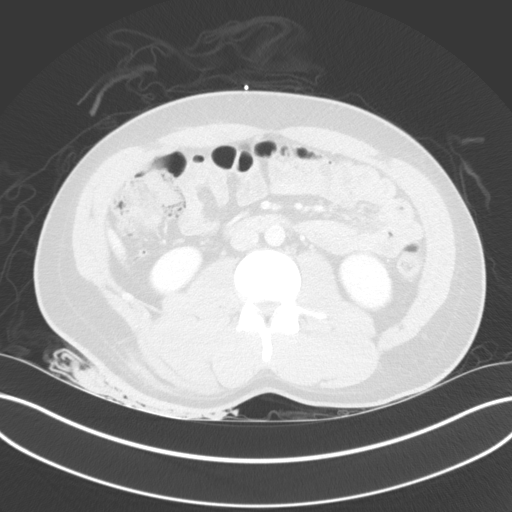
[im 76/106  soft-tissue]
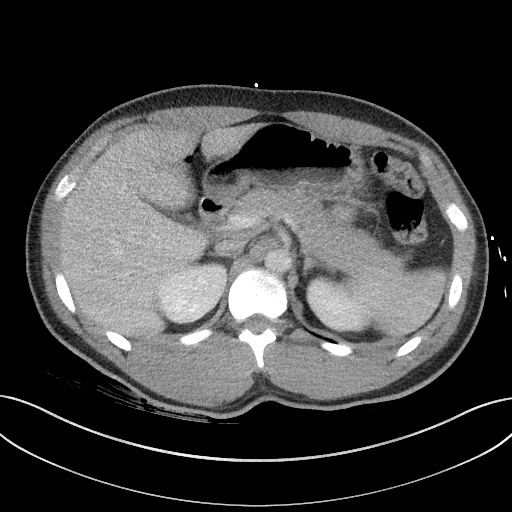
[im 76/106  lung]
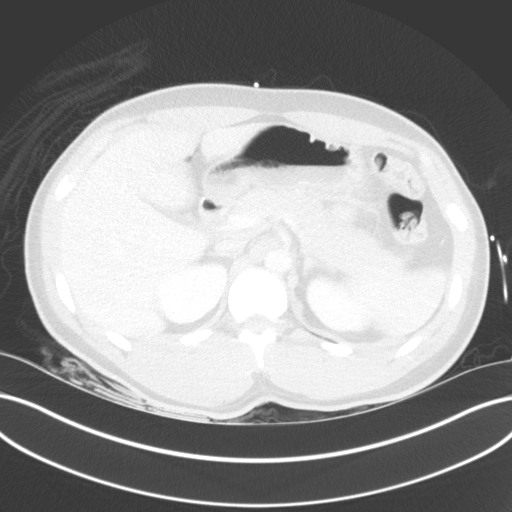
[im 91/106  soft-tissue]
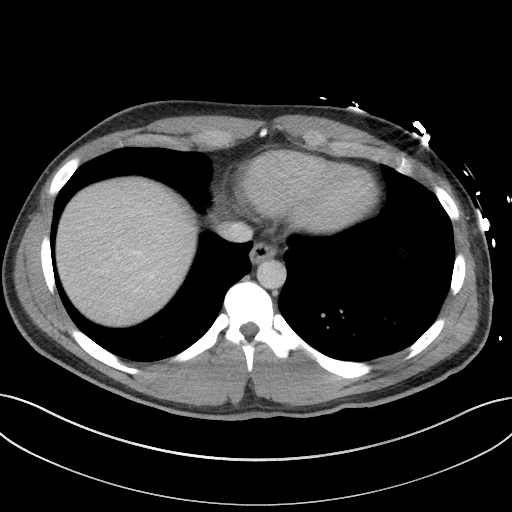
[im 91/106  lung]
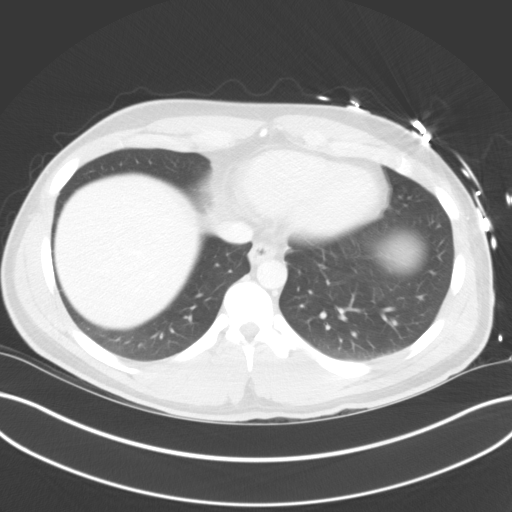

[Series 6: a/p w/ cor · coronal · 0.80mm/px · 3 of 136 slices shown]
[im 46/136  soft-tissue]
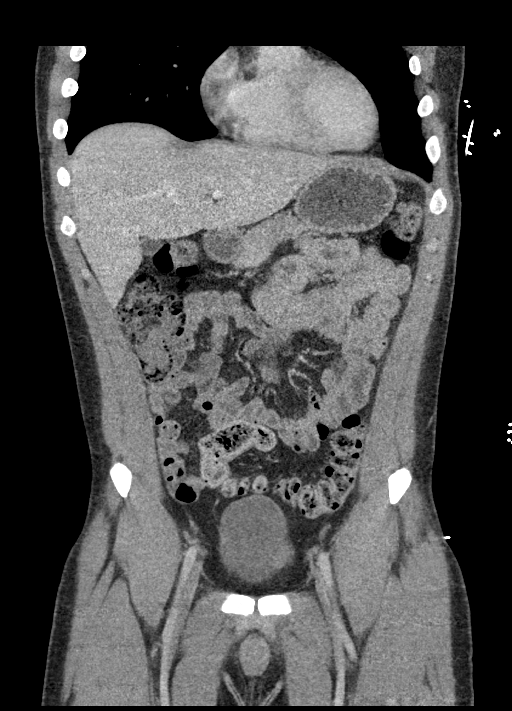
[im 68/136  soft-tissue]
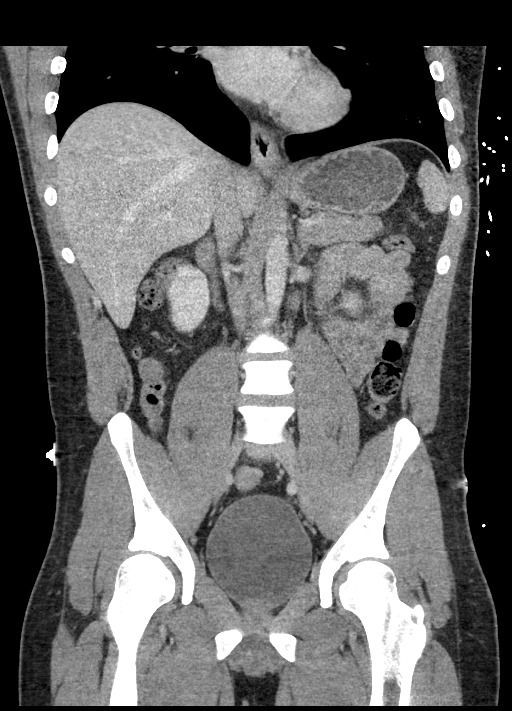
[im 91/136  soft-tissue]
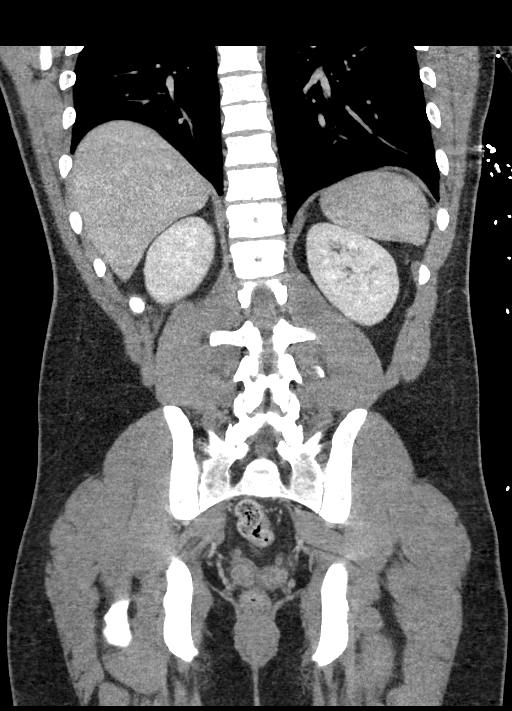

[Series 7: a/p w/ sag · sagittal · 0.62mm/px · 1 of 198 slices shown]
[im 99/198  soft-tissue]
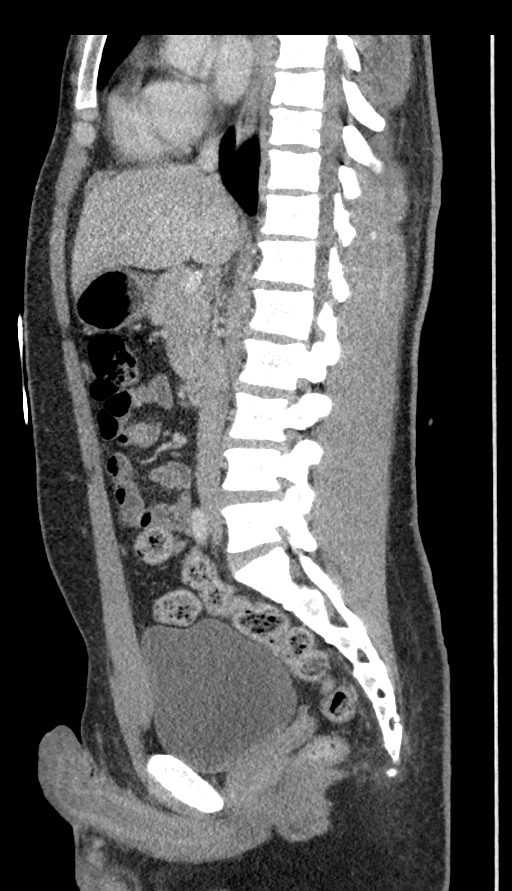

[10 of 46 positions shown; findings below may reference images not displayed]

FINDINGS: CT abdomen and pelvis:

Lower chest:  No acute findings.

Hepatobiliary: No masses or other significant abnormality.

Pancreas: No mass, inflammatory changes, or other significant
abnormality.

Spleen: Within normal limits in size and appearance.

Adrenals/Urinary Tract: No masses identified. No evidence of
hydronephrosis.

Stomach/Bowel: No evidence of obstruction, inflammatory process, or
abnormal fluid collections.

Vascular/Lymphatic: No pathologically enlarged lymph nodes. No
evidence of abdominal aortic aneurysm.

Reproductive: No mass or other significant abnormality.

Other: None.

Musculoskeletal: There is a large laceration of the right flank skin
extending into the right paraspinal muscles. Deep to the laceration
within right paraspinal muscles at the L1 level there is active
extravasation of contrast increasing between venous and delayed
phases (series 3, image 37 and series 2, image 25). The developing
hematoma measures approximately 22 x 30 mm axially.

There are several Schmorl's nodes of multiple endplates. No acute
fracture or dislocation is identified. L1 vertebral body hemangioma.

CT lumbar spine:

Alignment: Lumbar lordosis is maintained. No listhesis. Mild
leftward curvature with apex at L4.

Bones: No acute fracture or malalignment is identified. There are
endplate Schmorl's nodes of superior endplates of T12, L1, and L2.

Soft tissues: Laceration to the right flank with swelling of right
paraspinal muscles and developing right paraspinal muscle hematoma
better characterize the above.

Disc levels: There are multiple small disc bulges throughout the
lumbar spine without high-grade canal stenosis or foraminal
narrowing identified.
IMPRESSION: 1. Large right flank laceration. Active extravasation of contrast
into the right paraspinal muscles at the L1 level with developing
hematoma measuring up to approximately 3 cm. No associated large
vessel is identified.
2. No acute fracture or dislocation is identified.
3. No acute or organ or vascular injury in the abdomen or pelvis is
identified

By: Ferdi Can Doman M.D.
